# Patient Record
Sex: Female | Born: 1965 | Race: White | Hispanic: No | Marital: Married | Smoking: Former smoker
Health system: Southern US, Community
[De-identification: ages and names within clinical notes are randomized; demographics above are authoritative.]

## PROBLEM LIST (undated history)

## (undated) DIAGNOSIS — M1990 Unspecified osteoarthritis, unspecified site: Secondary | ICD-10-CM

## (undated) DIAGNOSIS — F319 Bipolar disorder, unspecified: Secondary | ICD-10-CM

## (undated) DIAGNOSIS — T7840XA Allergy, unspecified, initial encounter: Secondary | ICD-10-CM

## (undated) DIAGNOSIS — G2581 Restless legs syndrome: Secondary | ICD-10-CM

## (undated) DIAGNOSIS — D649 Anemia, unspecified: Secondary | ICD-10-CM

## (undated) DIAGNOSIS — F329 Major depressive disorder, single episode, unspecified: Secondary | ICD-10-CM

## (undated) DIAGNOSIS — F32A Depression, unspecified: Secondary | ICD-10-CM

## (undated) HISTORY — PX: CHOLECYSTECTOMY: SHX55

## (undated) HISTORY — PX: TUBAL LIGATION: SHX77

## (undated) HISTORY — PX: GASTRIC BYPASS: SHX52

## (undated) HISTORY — DX: Unspecified osteoarthritis, unspecified site: M19.90

## (undated) HISTORY — PX: BRACHIOPLASTY: SUR162

## (undated) HISTORY — DX: Allergy, unspecified, initial encounter: T78.40XA

## (undated) HISTORY — DX: Anemia, unspecified: D64.9

## (undated) HISTORY — PX: BREAST SURGERY: SHX581

---

## 1997-08-19 ENCOUNTER — Ambulatory Visit (HOSPITAL_COMMUNITY): Admission: RE | Admit: 1997-08-19 | Discharge: 1997-08-20 | Payer: Self-pay | Admitting: General Surgery

## 2004-10-09 ENCOUNTER — Ambulatory Visit: Payer: Self-pay

## 2004-10-15 ENCOUNTER — Ambulatory Visit: Payer: Self-pay

## 2011-05-01 ENCOUNTER — Other Ambulatory Visit: Payer: Self-pay

## 2011-05-01 ENCOUNTER — Emergency Department (HOSPITAL_BASED_OUTPATIENT_CLINIC_OR_DEPARTMENT_OTHER)
Admission: EM | Admit: 2011-05-01 | Discharge: 2011-05-01 | Disposition: A | Discharge status: Home / Self Care | Payer: No Typology Code available for payment source | Attending: Emergency Medicine | Admitting: Emergency Medicine

## 2011-05-01 ENCOUNTER — Emergency Department (INDEPENDENT_AMBULATORY_CARE_PROVIDER_SITE_OTHER): Payer: No Typology Code available for payment source

## 2011-05-01 ENCOUNTER — Encounter (HOSPITAL_BASED_OUTPATIENT_CLINIC_OR_DEPARTMENT_OTHER): Payer: Self-pay

## 2011-05-01 DIAGNOSIS — F3130 Bipolar disorder, current episode depressed, mild or moderate severity, unspecified: Secondary | ICD-10-CM | POA: Insufficient documentation

## 2011-05-01 DIAGNOSIS — R55 Syncope and collapse: Secondary | ICD-10-CM | POA: Insufficient documentation

## 2011-05-01 DIAGNOSIS — G930 Cerebral cysts: Secondary | ICD-10-CM | POA: Insufficient documentation

## 2011-05-01 DIAGNOSIS — S0993XA Unspecified injury of face, initial encounter: Secondary | ICD-10-CM

## 2011-05-01 DIAGNOSIS — W1809XA Striking against other object with subsequent fall, initial encounter: Secondary | ICD-10-CM

## 2011-05-01 DIAGNOSIS — S0990XA Unspecified injury of head, initial encounter: Secondary | ICD-10-CM

## 2011-05-01 DIAGNOSIS — M542 Cervicalgia: Secondary | ICD-10-CM | POA: Insufficient documentation

## 2011-05-01 DIAGNOSIS — R109 Unspecified abdominal pain: Secondary | ICD-10-CM | POA: Insufficient documentation

## 2011-05-01 DIAGNOSIS — R51 Headache: Secondary | ICD-10-CM

## 2011-05-01 DIAGNOSIS — S8000XA Contusion of unspecified knee, initial encounter: Secondary | ICD-10-CM | POA: Insufficient documentation

## 2011-05-01 DIAGNOSIS — R0602 Shortness of breath: Secondary | ICD-10-CM

## 2011-05-01 DIAGNOSIS — R079 Chest pain, unspecified: Secondary | ICD-10-CM | POA: Insufficient documentation

## 2011-05-01 HISTORY — DX: Restless legs syndrome: G25.81

## 2011-05-01 HISTORY — DX: Depression, unspecified: F32.A

## 2011-05-01 HISTORY — DX: Bipolar disorder, unspecified: F31.9

## 2011-05-01 HISTORY — DX: Major depressive disorder, single episode, unspecified: F32.9

## 2011-05-01 LAB — CBC
Platelets: 173 10*3/uL (ref 150–400)
RBC: 3.97 MIL/uL (ref 3.87–5.11)
RDW: 11.9 % (ref 11.5–15.5)
WBC: 7.5 10*3/uL (ref 4.0–10.5)

## 2011-05-01 LAB — BASIC METABOLIC PANEL
CO2: 28 mEq/L (ref 19–32)
Calcium: 9.2 mg/dL (ref 8.4–10.5)
Chloride: 102 mEq/L (ref 96–112)
Potassium: 3.5 mEq/L (ref 3.5–5.1)
Sodium: 138 mEq/L (ref 135–145)

## 2011-05-01 LAB — DIFFERENTIAL
Basophils Absolute: 0 10*3/uL (ref 0.0–0.1)
Eosinophils Relative: 1 % (ref 0–5)
Lymphocytes Relative: 29 % (ref 12–46)
Neutro Abs: 4.5 10*3/uL (ref 1.7–7.7)
Neutrophils Relative %: 60 % (ref 43–77)

## 2011-05-01 MED ORDER — OXYCODONE-ACETAMINOPHEN 5-325 MG PO TABS: 1.0000 | ORAL_TABLET | ORAL | Status: AC | PRN

## 2011-05-01 MED ORDER — MORPHINE SULFATE 4 MG/ML IJ SOLN
4.0000 mg | Freq: Once | INTRAMUSCULAR | Status: AC
  Administered 2011-05-01: 4 mg via INTRAVENOUS
  Filled 2011-05-01: qty 1

## 2011-05-01 MED ORDER — IOHEXOL 300 MG/ML  SOLN
100.0000 mL | Freq: Once | INTRAMUSCULAR | Status: AC | PRN
  Administered 2011-05-01: 100 mL via INTRAVENOUS

## 2011-05-01 NOTE — ED Notes (Signed)
Error in documentation-pt did not have hard c collar in place upon arrival-hard c collar placed prior to full EDP exam
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
## 2011-05-01 NOTE — ED Notes (Signed)
Pt ambulated in hallways NAD with EMT escort

## 2011-05-02 ENCOUNTER — Telehealth: Payer: Self-pay

## 2011-05-02 NOTE — Telephone Encounter (Signed)
PT STATES SHE HAD TO GO OVER TO [facility] AND WAS TOLD IF WE FAXED OVER A FORM ON OUR LETTERHEAD STATING WE NEED A COPY OF HER OV NOTES, THEY WILL FAX THEM TO [place]. PLEASE CALL PT AT [phone] AND THE FAX TO [facility] IS [phone]

## 2011-05-03 NOTE — Telephone Encounter (Signed)
Spoke with patient and let her know that she would need to sign a release before we obtain records from [facility].  Pt states that she will come by and pick up the release and take it over to [facility] before she is seen tomorrow

## 2011-05-04 ENCOUNTER — Ambulatory Visit (INDEPENDENT_AMBULATORY_CARE_PROVIDER_SITE_OTHER): Payer: BC Managed Care – PPO | Admitting: Family Medicine

## 2011-05-04 DIAGNOSIS — S139XXA Sprain of joints and ligaments of unspecified parts of neck, initial encounter: Secondary | ICD-10-CM

## 2011-05-04 DIAGNOSIS — S46819A Strain of other muscles, fascia and tendons at shoulder and upper arm level, unspecified arm, initial encounter: Secondary | ICD-10-CM

## 2011-05-04 DIAGNOSIS — Z043 Encounter for examination and observation following other accident: Secondary | ICD-10-CM

## 2011-05-04 MED ORDER — CYCLOBENZAPRINE HCL 5 MG PO TABS: 5.0000 mg | ORAL_TABLET | Freq: Every day | ORAL | Status: DC

## 2011-05-04 MED ORDER — TOPIRAMATE 50 MG PO TABS: 50.0000 mg | ORAL_TABLET | Freq: Every day | ORAL | Status: DC

## 2011-05-04 MED ORDER — MELOXICAM 15 MG PO TABS: 15.0000 mg | ORAL_TABLET | Freq: Every day | ORAL | Status: AC
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
## 2011-05-12 ENCOUNTER — Ambulatory Visit (INDEPENDENT_AMBULATORY_CARE_PROVIDER_SITE_OTHER): Payer: BC Managed Care – PPO | Admitting: Emergency Medicine

## 2011-05-12 VITALS — BP 114/79 | HR 71 | Temp 98.6°F | Resp 14 | Ht 63.5 in | Wt 194.8 lb

## 2011-05-12 DIAGNOSIS — R55 Syncope and collapse: Secondary | ICD-10-CM

## 2011-05-12 DIAGNOSIS — R51 Headache: Secondary | ICD-10-CM

## 2011-05-12 DIAGNOSIS — M549 Dorsalgia, unspecified: Secondary | ICD-10-CM

## 2011-05-12 DIAGNOSIS — I498 Other specified cardiac arrhythmias: Secondary | ICD-10-CM

## 2011-05-12 DIAGNOSIS — R001 Bradycardia, unspecified: Secondary | ICD-10-CM

## 2011-05-12 LAB — POCT CBC
Hemoglobin: 13.2 g/dL (ref 12.2–16.2)
Lymph, poc: 2.6 (ref 0.6–3.4)
MCH, POC: 31.6 pg — AB (ref 27–31.2)
MCHC: 33.3 g/dL (ref 31.8–35.4)
MPV: 12 fL (ref 0–99.8)
POC LYMPH PERCENT: 49.1 %L (ref 10–50)
POC MID %: 8.1 %M (ref 0–12)
WBC: 5.3 10*3/uL (ref 4.6–10.2)

## 2011-05-12 MED ORDER — CYCLOBENZAPRINE HCL 5 MG PO TABS: 5.0000 mg | ORAL_TABLET | Freq: Three times a day (TID) | ORAL | Status: AC | PRN
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
## 2011-05-13 LAB — COMPREHENSIVE METABOLIC PANEL
AST: 21 U/L (ref 0–37)
Albumin: 4.4 g/dL (ref 3.5–5.2)
Alkaline Phosphatase: 78 U/L (ref 39–117)
CO2: 26 mEq/L (ref 19–32)
Calcium: 9.2 mg/dL (ref 8.4–10.5)
Creat: 0.53 mg/dL (ref 0.50–1.10)
Glucose, Bld: 92 mg/dL (ref 70–99)
Total Protein: 7 g/dL (ref 6.0–8.3)

## 2011-05-14 ENCOUNTER — Telehealth: Payer: Self-pay

## 2011-05-14 NOTE — Telephone Encounter (Signed)
Patient states she is scheduled for a CT scan tomorrow, but just had one done 3 weeks ago. She would like to know if this is really necessary to do again because the first one was $500. If it is necessary, she will do it, but just wanted to verify with [place] before going through with it.

## 2011-05-14 NOTE — Telephone Encounter (Signed)
Patient notified that MD thinks it is important to repeat CT-head since she is still continuing to have headaches and other symptoms.  Patient understood and will continue on with plan.

## 2011-05-15 ENCOUNTER — Encounter: Payer: Self-pay | Admitting: *Deleted

## 2011-05-15 ENCOUNTER — Ambulatory Visit
Admission: RE | Admit: 2011-05-15 | Discharge: 2011-05-15 | Disposition: A | Discharge status: Home / Self Care | Payer: BC Managed Care – PPO | Source: Ambulatory Visit | Attending: Emergency Medicine | Admitting: Emergency Medicine

## 2011-05-15 DIAGNOSIS — R51 Headache: Secondary | ICD-10-CM

## 2011-05-15 DIAGNOSIS — R55 Syncope and collapse: Secondary | ICD-10-CM

## 2011-05-17 ENCOUNTER — Ambulatory Visit (INDEPENDENT_AMBULATORY_CARE_PROVIDER_SITE_OTHER): Payer: BC Managed Care – PPO | Admitting: Family Medicine

## 2011-05-17 VITALS — BP 149/93 | HR 85 | Temp 99.3°F | Resp 20 | Ht 63.5 in | Wt 195.0 lb

## 2011-05-17 DIAGNOSIS — R55 Syncope and collapse: Secondary | ICD-10-CM

## 2011-05-17 DIAGNOSIS — G44309 Post-traumatic headache, unspecified, not intractable: Secondary | ICD-10-CM

## 2011-05-17 DIAGNOSIS — R5383 Other fatigue: Secondary | ICD-10-CM

## 2011-05-17 DIAGNOSIS — R5381 Other malaise: Secondary | ICD-10-CM

## 2011-05-17 DIAGNOSIS — S0990XS Unspecified injury of head, sequela: Secondary | ICD-10-CM

## 2011-05-17 MED ORDER — KETOPROFEN 75 MG PO CAPS: 75.0000 mg | ORAL_CAPSULE | Freq: Three times a day (TID) | ORAL | Status: AC | PRN
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
## 2011-05-18 LAB — TSH: TSH: 1.003 u[IU]/mL (ref 0.350–4.500)

## 2011-09-14 ENCOUNTER — Ambulatory Visit (INDEPENDENT_AMBULATORY_CARE_PROVIDER_SITE_OTHER): Payer: BC Managed Care – PPO | Admitting: Physician Assistant

## 2011-09-14 VITALS — BP 108/60 | HR 69 | Temp 98.6°F | Resp 16 | Ht 63.5 in | Wt 198.0 lb

## 2011-09-14 DIAGNOSIS — N390 Urinary tract infection, site not specified: Secondary | ICD-10-CM

## 2011-09-14 DIAGNOSIS — R300 Dysuria: Secondary | ICD-10-CM

## 2011-09-14 LAB — POCT URINALYSIS DIPSTICK
Bilirubin, UA: NEGATIVE
Glucose, UA: NEGATIVE
Spec Grav, UA: 1.015

## 2011-09-14 LAB — POCT UA - MICROSCOPIC ONLY: Mucus, UA: NEGATIVE

## 2011-09-14 MED ORDER — CIPROFLOXACIN HCL 500 MG PO TABS: 500.0000 mg | ORAL_TABLET | Freq: Two times a day (BID) | ORAL | Status: AC
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
## 2011-09-16 LAB — URINE CULTURE
Colony Count: NO GROWTH
Organism ID, Bacteria: NO GROWTH

## 2012-03-28 DIAGNOSIS — Z0271 Encounter for disability determination: Secondary | ICD-10-CM

## 2012-04-01 ENCOUNTER — Ambulatory Visit: Payer: BC Managed Care – PPO

## 2012-04-01 ENCOUNTER — Ambulatory Visit (INDEPENDENT_AMBULATORY_CARE_PROVIDER_SITE_OTHER): Payer: BC Managed Care – PPO | Admitting: Family Medicine

## 2012-04-01 VITALS — BP 104/60 | HR 67 | Temp 98.5°F | Resp 16 | Ht 63.75 in | Wt 167.8 lb

## 2012-04-01 DIAGNOSIS — J029 Acute pharyngitis, unspecified: Secondary | ICD-10-CM

## 2012-04-01 DIAGNOSIS — E538 Deficiency of other specified B group vitamins: Secondary | ICD-10-CM

## 2012-04-01 DIAGNOSIS — R509 Fever, unspecified: Secondary | ICD-10-CM

## 2012-04-01 DIAGNOSIS — R059 Cough, unspecified: Secondary | ICD-10-CM

## 2012-04-01 DIAGNOSIS — R05 Cough: Secondary | ICD-10-CM

## 2012-04-01 DIAGNOSIS — Z9884 Bariatric surgery status: Secondary | ICD-10-CM

## 2012-04-01 LAB — POCT CBC
Granulocyte percent: 65.3 %G (ref 37–80)
HCT, POC: 42.4 % (ref 37.7–47.9)
Hemoglobin: 13.3 g/dL (ref 12.2–16.2)
Lymph, poc: 2.1 (ref 0.6–3.4)
MCH, POC: 30.9 pg (ref 27–31.2)
MCHC: 31.4 g/dL — AB (ref 31.8–35.4)
MCV: 98.5 fL — AB (ref 80–97)
MID (cbc): 0.6 (ref 0–0.9)
MPV: 11.4 fL (ref 0–99.8)
POC Granulocyte: 5.1 (ref 2–6.9)
POC LYMPH PERCENT: 26.4 %L (ref 10–50)
POC MID %: 8.3 %M (ref 0–12)
Platelet Count, POC: 246 10*3/uL (ref 142–424)
RBC: 4.3 M/uL (ref 4.04–5.48)
RDW, POC: 15.1 %
WBC: 7.8 10*3/uL (ref 4.6–10.2)

## 2012-04-01 LAB — VITAMIN B12: Vitamin B-12: 426 pg/mL (ref 211–911)

## 2012-04-01 LAB — FERRITIN: Ferritin: 52 ng/mL (ref 10–291)

## 2012-04-01 LAB — IRON AND TIBC
%SAT: 20 % (ref 20–55)
Iron: 89 ug/dL (ref 42–145)
TIBC: 435 ug/dL (ref 250–470)
UIBC: 346 ug/dL (ref 125–400)

## 2012-04-01 LAB — POCT RAPID STREP A (OFFICE): Rapid Strep A Screen: NEGATIVE

## 2012-04-01 MED ORDER — PREDNISONE 20 MG PO TABS: ORAL_TABLET | ORAL | Status: DC

## 2012-04-01 MED ORDER — AMOXICILLIN-POT CLAVULANATE 875-125 MG PO TABS: 1.0000 | ORAL_TABLET | Freq: Two times a day (BID) | ORAL | Status: DC

## 2012-04-01 MED ORDER — HYDROCOD POLST-CHLORPHEN POLST 10-8 MG/5ML PO LQCR: 5.0000 mL | Freq: Two times a day (BID) | ORAL | Status: DC | PRN

## 2012-04-01 NOTE — Progress Notes (Signed)
Subjective:    Patient ID: Lori Conway, female    DOB: 02-16-1965, 47 y.o.   MRN: [id number]  HPI 47 year old female presents with 4 week history of cough, nasal congestion, sore throat, fatigue, and sinus pain.  Admits that symptoms have "come and gone" a few time but for the last week have persisted and progressively worsened.  Does admit that her cough is productive at times and she has pain on her left side along her rib cage secondary to coughing.  Symptoms started as a "sinus infection" with sinus pain and thick nasal discharge, but now those symptoms have improved slightly.   Has a history of gastric bypass surgery and has been on B12 injections, iron, and vit. D in the past, but has not taken these in "a long time."  Would like these levels checked today because she has been fatigued and would like to see if she needs to re-start these.      Review of Systems  Constitutional: Positive for fever and chills.  HENT: Positive for ear pain (bilateral ear pressure), congestion, sore throat, rhinorrhea, postnasal drip and sinus pressure.   Respiratory: Positive for cough. Negative for shortness of breath and wheezing.   Gastrointestinal: Negative for nausea, vomiting and abdominal pain.  Neurological: Negative for dizziness and headaches.       Objective:   Physical Exam  Constitutional: She is oriented to person, place, and time. She appears well-developed and well-nourished.  HENT:  Head: Normocephalic and atraumatic.  Right Ear: Hearing, tympanic membrane, external ear and ear canal normal.  Left Ear: Hearing, tympanic membrane, external ear and ear canal normal.  Nose: Right sinus exhibits no maxillary sinus tenderness and no frontal sinus tenderness. Left sinus exhibits maxillary sinus tenderness. Left sinus exhibits no frontal sinus tenderness.  Mouth/Throat: Uvula is midline and mucous membranes are normal. Posterior oropharyngeal erythema (bilateral 3+ tonsillar swelling)  present. No oropharyngeal exudate or tonsillar abscesses.  Eyes: Conjunctivae are normal.  Neck: Normal range of motion. Neck supple.  Cardiovascular: Normal rate, regular rhythm and normal heart sounds.   Pulmonary/Chest: Effort normal and breath sounds normal.  Lymphadenopathy:    She has no cervical adenopathy.  Neurological: She is alert and oriented to person, place, and time.  Psychiatric: She has a normal mood and affect. Her behavior is normal. Judgment and thought content normal.     Results for orders placed in visit on 04/01/12  POCT CBC      Result Value Range   WBC 7.8  4.6 - 10.2 K/uL   Lymph, poc 2.1  0.6 - 3.4   POC LYMPH PERCENT 26.4  10 - 50 %L   MID (cbc) 0.6  0 - 0.9   POC MID % 8.3  0 - 12 %M   POC Granulocyte 5.1  2 - 6.9   Granulocyte percent 65.3  37 - 80 %G   RBC 4.30  4.04 - 5.48 M/uL   Hemoglobin 13.3  12.2 - 16.2 g/dL   HCT, POC 40.9  81.1 - 47.9 %   MCV 98.5 (*) 80 - 97 fL   MCH, POC 30.9  27 - 31.2 pg   MCHC 31.4 (*) 31.8 - 35.4 g/dL   RDW, POC 91.4     Platelet Count, POC 246  142 - 424 K/uL   MPV 11.4  0 - 99.8 fL  POCT RAPID STREP A (OFFICE)      Result Value Range   Rapid Strep A Screen  Negative  Negative   [facility] reading (PRIMARY) by  Dr. Katrinka Blazing as normal chest x-ray. No acute infiltration or consolidation noted.        Assessment & Plan:  1. Fever, unspecified - Plan: POCT CBC  2. Cough - Plan: POCT CBC, DG Chest 2 View, chlorpheniramine-HYDROcodone (TUSSIONEX PENNKINETIC ER) 10-8 MG/5ML LQCR  3. B12 deficiency - Plan: Vitamin B12  4. Acute pharyngitis - Plan: POCT rapid strep A, Culture, Group A Strep, amoxicillin-clavulanate (AUGMENTIN) 875-125 MG per tablet, predniSONE (DELTASONE) 20 MG tablet  5. H/O gastric bypass - Plan: Vitamin D 25 hydroxy, Ferritin, Iron and TIBC  Will go ahead and treat with Augmentin 875 mg bid x 10 days to cover both strep and sinusitis Throat culture sent Tussionex qhs prn cough Increase fluids and  rest Labs pending.  Follow up with vitamins and B12 injections if needed.  RTC or go to ER with any worsening symptoms

## 2012-04-02 LAB — VITAMIN D 25 HYDROXY (VIT D DEFICIENCY, FRACTURES): Vit D, 25-Hydroxy: 14 ng/mL — ABNORMAL LOW (ref 30–89)

## 2012-04-02 NOTE — Progress Notes (Signed)
History and physical examinations reviewed in detail with Rhoderick Moody, PA-C.  CXR reviewed.  Agree with current assessment and plan.

## 2012-04-03 LAB — CULTURE, GROUP A STREP: Organism ID, Bacteria: NORMAL

## 2012-04-10 ENCOUNTER — Telehealth: Payer: Self-pay

## 2012-04-10 MED ORDER — FLUCONAZOLE 150 MG PO TABS: 150.0000 mg | ORAL_TABLET | Freq: Once | ORAL | Status: DC

## 2012-04-10 NOTE — Telephone Encounter (Signed)
Regesting Diflucon  - [place] [facility]  CBN [phone]

## 2012-04-10 NOTE — Telephone Encounter (Signed)
Sent in Diflucan, have just finished antibiotics.

## 2012-04-13 ENCOUNTER — Encounter: Payer: Self-pay | Admitting: Family Medicine

## 2012-10-26 ENCOUNTER — Ambulatory Visit (INDEPENDENT_AMBULATORY_CARE_PROVIDER_SITE_OTHER): Payer: BC Managed Care – PPO | Admitting: Family Medicine

## 2012-10-26 VITALS — BP 112/66 | HR 67 | Temp 98.9°F | Resp 18 | Ht 65.0 in | Wt 158.0 lb

## 2012-10-26 DIAGNOSIS — J039 Acute tonsillitis, unspecified: Secondary | ICD-10-CM

## 2012-10-26 DIAGNOSIS — L708 Other acne: Secondary | ICD-10-CM

## 2012-10-26 DIAGNOSIS — J029 Acute pharyngitis, unspecified: Secondary | ICD-10-CM

## 2012-10-26 DIAGNOSIS — E559 Vitamin D deficiency, unspecified: Secondary | ICD-10-CM

## 2012-10-26 DIAGNOSIS — L709 Acne, unspecified: Secondary | ICD-10-CM

## 2012-10-26 DIAGNOSIS — R5381 Other malaise: Secondary | ICD-10-CM

## 2012-10-26 LAB — POCT CBC
Granulocyte percent: 65.5 %G (ref 37–80)
HCT, POC: 39.5 % (ref 37.7–47.9)
Hemoglobin: 11.9 g/dL — AB (ref 12.2–16.2)
Lymph, poc: 2 (ref 0.6–3.4)
MCH, POC: 31.2 pg (ref 27–31.2)
MCHC: 30.1 g/dL — AB (ref 31.8–35.4)
MCV: 103.7 fL — AB (ref 80–97)
MID (cbc): 0.5 (ref 0–0.9)
MPV: 13.1 fL (ref 0–99.8)
POC Granulocyte: 4.8 (ref 2–6.9)
POC LYMPH PERCENT: 27 %L (ref 10–50)
POC MID %: 7.5 %M (ref 0–12)
Platelet Count, POC: 145 10*3/uL (ref 142–424)
RBC: 3.81 M/uL — AB (ref 4.04–5.48)
RDW, POC: 14 %
WBC: 7.3 10*3/uL (ref 4.6–10.2)

## 2012-10-26 LAB — POCT RAPID STREP A (OFFICE): Rapid Strep A Screen: NEGATIVE

## 2012-10-26 MED ORDER — FLUCONAZOLE 150 MG PO TABS: 150.0000 mg | ORAL_TABLET | Freq: Once | ORAL | Status: AC

## 2012-10-26 MED ORDER — VITAMIN D (ERGOCALCIFEROL) 1.25 MG (50000 UNIT) PO CAPS: 50000.0000 [IU] | ORAL_CAPSULE | ORAL | Status: AC

## 2012-10-26 MED ORDER — GUAIFENESIN 200 MG PO TABS: 400.0000 mg | ORAL_TABLET | ORAL | Status: AC | PRN

## 2012-10-26 MED ORDER — AZITHROMYCIN 250 MG PO TABS: ORAL_TABLET | ORAL | Status: AC

## 2012-10-26 MED ORDER — DOXYCYCLINE HYCLATE 100 MG PO TABS: 100.0000 mg | ORAL_TABLET | Freq: Every day | ORAL | Status: AC

## 2012-10-26 NOTE — Progress Notes (Addendum)
[facility]:  Office Visit  Chief Complaint:  Chief Complaint  Patient presents with  . sore throat, drainage, fever, body aches    fever off and on x5 days     HPI: Lori Conway is a 47 y.o. female who complains of  sore throat, intermittent fever and sinus drainage, since last Thursday,  5 days ago. She had a temp 100-100.1, took tylenol, this AM was 100 before took Tylenol.  + sinus pain, throat gland swelling. Feels very fatigued, more so then her normal bouts of strep throat. History of enlarged tonsils. She has had gastric bypass and last time she was here had her vitmain schecked and her Vit D was low but has not been taking it.  Nonsmoker. No asthma. No allergies. No SOB, CP, palpitations, wheezes  Past Medical History  Diagnosis Date  . Restless leg syndrome   . Depression   . Bipolar disorder   . Allergy   . Anemia   . Arthritis    Past Surgical History  Procedure Laterality Date  . Cholecystectomy    . Gastric bypass    . Cesarean section    . Brachioplasty    . Breast surgery    . Tubal ligation     History   Social History  . Marital Status: Married    Spouse Name: N/A    Number of Children: N/A  . Years of Education: N/A   Social History Main Topics  . Smoking status: Former Smoker    Quit date: 05/03/2001  . Smokeless tobacco: None  . Alcohol Use: No  . Drug Use: No  . Sexual Activity: Yes    Birth Control/ Protection: Surgical   Other Topics Concern  . None   Social History Narrative  . None   Family History  Problem Relation Age of Onset  . Arthritis Mother   . Heart disease Mother   . Hypertension Mother   . Heart disease Father   . Hypertension Father   . Arthritis Father   . Diabetes Father   . Heart disease Maternal Grandmother   . Arthritis Maternal Grandfather   . Heart disease Paternal Grandmother   . Heart disease Paternal Grandfather    Allergies  Allergen Reactions  . Dilaudid [Hydromorphone Hcl]  Shortness Of Breath and Itching   Prior to Admission medications   Medication Sig Start Date End Date Taking? Authorizing Provider  cholecalciferol (VITAMIN D) 1000 UNITS tablet Take 1,000 Units by mouth daily.   Yes Historical Provider, MD  GLUCOSAMINE PO Take 1 tablet by mouth daily.   Yes Historical Provider, MD  lisdexamfetamine (VYVANSE) 70 MG capsule Take 70 mg by mouth every morning.   Yes Historical Provider, MD  Multiple Vitamin (MULTIVITAMIN) tablet Take 1 tablet by mouth daily.   Yes Historical Provider, MD  Omega-3 Fatty Acids (FISH OIL PO) Take 1 capsule by mouth daily.   Yes Historical Provider, MD  Probiotic Product (PROBIOTIC FORMULA PO) Take 1 tablet by mouth daily.   Yes Historical Provider, MD  amoxicillin-clavulanate (AUGMENTIN) 875-125 MG per tablet Take 1 tablet by mouth 2 (two) times daily. 04/01/12   Heather Jaquita Rector, PA-C  chlorpheniramine-HYDROcodone (TUSSIONEX PENNKINETIC ER) 10-8 MG/5ML LQCR Take 5 mLs by mouth every 12 (twelve) hours as needed (cough). 04/01/12   Heather Jaquita Rector, PA-C  clonazePAM (KLONOPIN) 0.5 MG tablet Take 0.5 mg by mouth 2 (two) times daily as needed.    Historical Provider, MD  ferrous sulfate  325 (65 FE) MG tablet Take 325 mg by mouth daily with breakfast.    Historical Provider, MD  fluconazole (DIFLUCAN) 150 MG tablet Take 1 tablet (150 mg total) by mouth once. Repeat if needed 04/10/12   Nelva Nay, PA-C  fluvoxaMINE (LUVOX) 50 MG tablet Take 50 mg by mouth at bedtime.    Historical Provider, MD  predniSONE (DELTASONE) 20 MG tablet Take 2 PO QAM x 3days, 1 PO QAM x3days 04/01/12   Heather M Marte, PA-C     ROS: The patient denies fevers, chills, night sweats, unintentional weight loss, chest pain, palpitations, wheezing, dyspnea on exertion, nausea, vomiting, abdominal pain, dysuria, hematuria, melena, numbness, weakness, or tingling.   All other systems have been reviewed and were otherwise negative with the exception of those mentioned in  the HPI and as above.    PHYSICAL EXAM: Filed Vitals:   10/26/12 1350  BP: 112/66  Pulse: 67  Temp: 98.9 F (37.2 C)  Resp: 18   Filed Vitals:   10/26/12 1350  Height: 5\' 5"  (1.651 m)  Weight: 158 lb (71.668 kg)   Body mass index is 26.29 kg/(m^2).  General: Alert, no acute distress HEENT:  Normocephalic, atraumatic, oropharynx patent. EOMI, PERRLA, + enlarge +3 tonsills bilaterally, erythematous without exudates. No sinus tenderness Cardiovascular:  Regular rate and rhythm, no rubs murmurs or gallops.  No Carotid bruits, radial pulse intact. No pedal edema.  Respiratory: Clear to auscultation bilaterally.  No wheezes, rales, or rhonchi.  No cyanosis, no use of accessory musculature GI: No organomegaly, abdomen is soft and non-tender, positive bowel sounds.  No masses. Skin: + closed comedones on chin Neurologic: Facial musculature symmetric. Psychiatric: Patient is appropriate throughout our interaction. Lymphatic: No cervical lymphadenopathy Musculoskeletal: Gait intact.   LABS: Results for orders placed in visit on 10/26/12  POCT RAPID STREP A (OFFICE)      Result Value Range   Rapid Strep A Screen Negative  Negative  POCT CBC      Result Value Range   WBC 7.3  4.6 - 10.2 K/uL   Lymph, poc 2.0  0.6 - 3.4   POC LYMPH PERCENT 27.0  10 - 50 %L   MID (cbc) 0.5  0 - 0.9   POC MID % 7.5  0 - 12 %M   POC Granulocyte 4.8  2 - 6.9   Granulocyte percent 65.5  37 - 80 %G   RBC 3.81 (*) 4.04 - 5.48 M/uL   Hemoglobin 11.9 (*) 12.2 - 16.2 g/dL   HCT, POC 96.0  45.4 - 47.9 %   MCV 103.7 (*) 80 - 97 fL   MCH, POC 31.2  27 - 31.2 pg   MCHC 30.1 (*) 31.8 - 35.4 g/dL   RDW, POC 09.8     Platelet Count, POC 145  142 - 424 K/uL   MPV 13.1  0 - 99.8 fL     EKG/XRAY:   Primary read interpreted by Dr. Conley Rolls at [facility].   ASSESSMENT/PLAN: Encounter Diagnoses  Name Primary?  . Acute pharyngitis Yes  . Vitamin D deficiency   . Acne   . Other malaise and fatigue   . Acute  tonsillitis     Will rx azithromycin in event this is mono, if no improvement and EBV test is negative then consider Augmentin but give 72 hours Declines prednisone since it makes her want to eat more.   EBV IgM pending She has a history of anemia and her HGb is  stable about 12 Will give her large dose of Vitamin D 50K IU x 4 months and she will return to get vit D rechecked at that time. Patient declined strep culture, she has a 10% copay and does not want to pay for it. She asked for a rx for doxy 100 mg daily for acne, which I filled, she should take this when her current sxs resolve. Advise to avoid abx unless needed for ppx acne Gross sideeffects, risk and benefits, and alternatives of medications d/w patient. Patient is aware that all medications have potential sideeffects and we are unable to predict every sideeffect or drug-drug interaction that may occur.  Nicoya Friel PHUONG, DO 10/27/2012 11:10 AM

## 2012-10-27 LAB — EPSTEIN-BARR VIRUS VCA, IGM: EBV VCA IgM: <10 U/mL (ref <36.0)

## 2012-12-03 ENCOUNTER — Other Ambulatory Visit: Payer: Self-pay

## 2012-12-14 ENCOUNTER — Ambulatory Visit (INDEPENDENT_AMBULATORY_CARE_PROVIDER_SITE_OTHER): Payer: BC Managed Care – PPO | Admitting: Family Medicine

## 2012-12-14 VITALS — BP 125/65 | HR 67 | Temp 98.7°F | Resp 16 | Ht 64.0 in | Wt 153.0 lb

## 2012-12-14 DIAGNOSIS — J020 Streptococcal pharyngitis: Secondary | ICD-10-CM

## 2012-12-14 DIAGNOSIS — J029 Acute pharyngitis, unspecified: Secondary | ICD-10-CM

## 2012-12-14 LAB — POCT RAPID STREP A (OFFICE): Rapid Strep A Screen: POSITIVE — AB

## 2012-12-14 MED ORDER — AMOXICILLIN 500 MG PO CAPS: 500.0000 mg | ORAL_CAPSULE | Freq: Two times a day (BID) | ORAL | Status: AC

## 2012-12-14 NOTE — Progress Notes (Signed)
Lori Conway is a 47 y.o. female who presents to [facility] Care today with complaints of sore throat:  1.  Sore throat:  Present for 3 days.  Has history of recurrent strep infections, documented at outside facilities.  Two negative strep screens this year.  STates she has documented strep at least 3 x yearly plus other episodes of sore throat anywhere from 2-4 times a year.  Told tonsils always "swollen" but worse when symptomatic.    Currently, describes pain with swallowing food or liquid, fevers, chills since Saturday.  No rhinorrhea/cough.  Some mild nausea today, no vomiting.  Eating and drinking well.  Has tried OTC anti-pyretics for relief.  No documented fever.    PMH reviewed.  Past Medical History  Diagnosis Date  . Restless leg syndrome   . Depression   . Bipolar disorder   . Allergy   . Anemia   . Arthritis    Past Surgical History  Procedure Laterality Date  . Cholecystectomy    . Gastric bypass    . Cesarean section    . Brachioplasty    . Breast surgery    . Tubal ligation      Medications reviewed. Current Outpatient Prescriptions  Medication Sig Dispense Refill  . cholecalciferol (VITAMIN D) 1000 UNITS tablet Take 1,000 Units by mouth daily.      . clonazePAM (KLONOPIN) 0.5 MG tablet Take 0.5 mg by mouth 2 (two) times daily as needed.      . doxycycline (VIBRA-TABS) 100 MG tablet Take 1 tablet (100 mg total) by mouth daily.  30 tablet  2  . fluvoxaMINE (LUVOX) 50 MG tablet Take 50 mg by mouth at bedtime.      Marland Kitchen GLUCOSAMINE PO Take 1 tablet by mouth daily.      Marland Kitchen lisdexamfetamine (VYVANSE) 70 MG capsule Take 70 mg by mouth every morning.      . Multiple Vitamin (MULTIVITAMIN) tablet Take 1 tablet by mouth daily.      . Omega-3 Fatty Acids (FISH OIL PO) Take 1 capsule by mouth daily.      . Probiotic Product (PROBIOTIC FORMULA PO) Take 1 tablet by mouth daily.      . Vitamin D, Ergocalciferol, (DRISDOL) 50000 UNITS CAPS capsule Take 1 capsule (50,000 Units total)  by mouth every 7 (seven) days.  16 capsule  0  . azithromycin (ZITHROMAX) 250 MG tablet Take 2 tabs po now then 1 tab po daily  6 tablet  0  . ferrous sulfate 325 (65 FE) MG tablet Take 325 mg by mouth daily with breakfast.      . fluconazole (DIFLUCAN) 150 MG tablet Take 1 tablet (150 mg total) by mouth once. Repeat if needed  2 tablet  0  . guaiFENesin 200 MG tablet Take 2 tablets (400 mg total) by mouth every 4 (four) hours as needed for congestion.  30 suppository  0   No current facility-administered medications for this visit.    ROS as above otherwise neg.     Physical Exam:  BP 125/65  Pulse 67  Temp(Src) 98.7 F (37.1 C)  Resp 16  Ht 5\' 4"  (1.626 m)  Wt 153 lb (69.4 kg)  BMI 26.25 kg/m2  SpO2 99%  LMP 08/23/2011 Gen:  Patient sitting on exam table, appears stated age in no acute distress Head: Normocephalic atraumatic Eyes: EOMI, PERRL, sclera and conjunctiva non-erythematous Ears:  Canals clear bilaterally.  TMs pearly gray bilaterally without erythema or bulging.   Nose:  Nares patent Mouth: Mucosa membranes moist. Tonsils +4/kissing.  Erythematous.  No exudates noted.  Some tonsillar crypts noteds.   Neck: No cervical lymphadenopathy noted but able to palpapate submandibular glands BL.   Heart:  RRR, no murmurs auscultated. Pulm:  Clear to auscultation bilaterally with good air movement.  No wheezes or rales noted.     Assessment and Plan:  1.  Sore throat: - strep test:  Positive - Treat with Amox x 10 days - recurrent episodes, multiple times a year as documented above. - Will refer to ENT as she is interested in this and hopefully this can prevent multiple illnesses per year.

## 2012-12-14 NOTE — Patient Instructions (Signed)
Your strep test was positive  Take the Amoxicillin for the next 10 days.    I will refer you to ENT as well.
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
